# Patient Record
Sex: Male | Born: 1995 | Race: White | Hispanic: No | Marital: Single | Smoking: Never smoker
Health system: Southern US, Community
[De-identification: ages and names within clinical notes are randomized; demographics above are authoritative.]

---

## 2020-01-25 ENCOUNTER — Emergency Department: Payer: Self-pay

## 2020-01-25 ENCOUNTER — Encounter: Payer: Self-pay | Admitting: Emergency Medicine

## 2020-01-25 DIAGNOSIS — Y990 Civilian activity done for income or pay: Secondary | ICD-10-CM | POA: Insufficient documentation

## 2020-01-25 DIAGNOSIS — W000XXA Fall on same level due to ice and snow, initial encounter: Secondary | ICD-10-CM | POA: Insufficient documentation

## 2020-01-25 DIAGNOSIS — S82842A Displaced bimalleolar fracture of left lower leg, initial encounter for closed fracture: Secondary | ICD-10-CM | POA: Insufficient documentation

## 2020-01-25 NOTE — ED Triage Notes (Signed)
Pt arrived via EMS from work event where pt has been consuming copious amounts of alcohol tonight and slipped on ice and twisted the  left ankle. Pt has obvious deformity of area with swelling.

## 2020-01-26 ENCOUNTER — Emergency Department
Admission: EM | Admit: 2020-01-26 | Discharge: 2020-01-26 | Disposition: A | Discharge status: Home / Self Care | Payer: Self-pay | Attending: Emergency Medicine | Admitting: Emergency Medicine

## 2020-01-26 DIAGNOSIS — S82842A Displaced bimalleolar fracture of left lower leg, initial encounter for closed fracture: Secondary | ICD-10-CM

## 2020-01-26 DIAGNOSIS — W19XXXA Unspecified fall, initial encounter: Secondary | ICD-10-CM

## 2020-01-26 MED ORDER — HYDROCODONE-ACETAMINOPHEN 5-325 MG PO TABS: 2.0000 | ORAL_TABLET | Freq: Four times a day (QID) | ORAL | 0 refills | Status: AC | PRN

## 2020-01-26 MED ORDER — HYDROCODONE-ACETAMINOPHEN 5-325 MG PO TABS
2.0000 | ORAL_TABLET | Freq: Once | ORAL | Status: AC
  Administered 2020-01-26: 2 via ORAL
  Filled 2020-01-26: qty 2

## 2020-01-26 NOTE — ED Notes (Signed)
Pt refuses to wear paper pants and request that he keeps his pants on and he will cut them when he leaves ED.

## 2020-01-26 NOTE — ED Provider Notes (Signed)
[facility] Emergency Department Provider Note  ____________________________________________   Event Date/Time   First MD Initiated Contact with Patient 01/26/20 [phone]     (approximate)  I have reviewed the triage vital signs and the nursing notes.   HISTORY  Chief Complaint Fall    HPI Bryce Gardner is a 25 y.o. male reports no chronic medical issues and presents for evaluation of acute onset pain and swelling in his left ankle after a fall.  He reports that he is visiting this area from [place] for work-related trip.  He was at work already and consuming alcohol and slipped on the ice and fell, noting that his ankle was deformed and he felt a pop.  No numbness nor tingling, able to move his toes.  No other associated injuries, no loss of consciousness.   The pain is mild to moderate at rest, severe with movement.  Unable to bear weight.        History reviewed. No pertinent past medical history.  There are no problems to display for this patient.   History reviewed. No pertinent surgical history.  Prior to Admission medications   Medication Sig Start Date End Date Taking? Authorizing Provider  HYDROcodone-acetaminophen (NORCO/VICODIN) 5-325 MG tablet Take 2 tablets by mouth every 6 (six) hours as needed for moderate pain or severe pain. 01/26/20  Yes Loleta Rose, MD    Allergies Patient has no known allergies.  History reviewed. No pertinent family history.  Social History Social History   Tobacco Use  . Smoking status: Never Smoker  . Smokeless tobacco: Never Used  Substance Use Topics  . Alcohol use: Yes  . Drug use: Never    Review of Systems Constitutional: No fever/chills Cardiovascular: Denies chest pain. Respiratory: Denies shortness of breath. Gastrointestinal: No abdominal pain.   Musculoskeletal: Left ankle pain and swelling. Neurological: Negative for headaches, focal weakness or  numbness.   ____________________________________________   PHYSICAL EXAM:  VITAL SIGNS: ED Triage Vitals [01/25/20 2212]  Enc Vitals Group     BP (!) 144/91     Pulse Rate (!) 114     Resp 18     Temp 98.1 F (36.7 C)     Temp Source Oral     SpO2 98 %     Weight      Height      Head Circumference      Peak Flow      Pain Score      Pain Loc      Pain Edu?      Excl. in GC?     Constitutional: Alert and oriented.  Eyes: Conjunctivae are normal.  Head: Atraumatic. Neck: No stridor.  No meningeal signs.   Cardiovascular: Mild tachycardia, regular rhythm. Good peripheral circulation. Respiratory: Normal respiratory effort.  No retractions. Musculoskeletal: Ecchymosis and edema of that left ankle, present on both the lateral and medial sides but is actually more pronounced on the medial side.  Tender to palpation throughout.  Unable to tolerate range of motion.  Neurovascularly intact.  There is a small abrasion on the ankle but there is no evidence of open fracture or substantial deformity except for the swelling. Neurologic:  Normal speech and language. No gross focal neurologic deficits are appreciated.  Skin:  Skin is warm, dry and intact except as described above.   ____________________________________________   LABS (all labs ordered are listed, but only abnormal results are displayed)  Labs Reviewed - No data to display  ____________________________________________  EKG  No indication for emergent EKG ____________________________________________  RADIOLOGY I, Loleta Rose, personally viewed and evaluated these images (plain radiographs) as part of my medical decision making, as well as reviewing the written report by the radiologist.  ED MD interpretation: Oblique fracture of the distal fibula and possible posterior malleolus fracture with some mild lateral subluxation of the ankle mortise.  Official radiology report(s): DG Ankle Complete Left  Result  Date: 01/25/2020 CLINICAL DATA:  25 year old male with fall and trauma to the left ankle. EXAM: LEFT ANKLE COMPLETE - 3+ VIEW COMPARISON:  None. FINDINGS: Mildly displaced oblique fracture of the distal fibula with approximately 2 mm lateral displacement of the distal fracture fragment. A fracture of the posterior malleolus is not excluded on the lateral view. There is mild lateral subluxation of the ankle mortise. There is soft tissue swelling of the ankle. IMPRESSION: 1. Mildly displaced oblique fracture of the distal fibula and questionable fracture of the posterior malleolus. 2. Mild lateral subluxation of the ankle mortise. Electronically Signed   By: Elgie Collard M.D.   On: 01/25/2020 22:38    ____________________________________________   PROCEDURES   Procedure(s) performed (including Critical Care):  .Ortho Injury Treatment  Date/Time: 01/26/2020 2:30 AM Performed by: Loleta Rose, MD Authorized by: Loleta Rose, MD   Consent:    Consent obtained:  Verbal   Consent given by:  Patient   Risks discussed:  FractureInjury location: ankle Location details: left ankle Injury type: fracture Fracture type: bimalleolar Pre-procedure neurovascular assessment: neurovascularly intact Pre-procedure distal perfusion: normal Pre-procedure neurological function: normal Pre-procedure range of motion: reduced Manipulation performed: no Immobilization: splint Splint type: short leg and ankle stirrup Supplies used: Ortho-Glass Post-procedure neurovascular assessment: post-procedure neurovascularly intact Post-procedure distal perfusion: normal Post-procedure neurological function: normal Post-procedure range of motion: unchanged Patient tolerance: patient tolerated the procedure well with no immediate complications      ____________________________________________   INITIAL IMPRESSION / MDM / ASSESSMENT AND PLAN / ED COURSE  As part of my medical decision making, I reviewed  the following data within the electronic MEDICAL RECORD NUMBER Nursing notes reviewed and incorporated, Radiograph reviewed , Discussed with orthopedic surgeon, Notes from prior ED visits and [place] Controlled Substance Database  I personally reviewed the patient's imaging and agree with the radiologist's interpretation that the patient has an ankle fracture, essentially a bimalleolar fracture.  I called and spoke by phone with orthopedic surgery, Dr. Signa Kell, who advised that the patient likely would need surgery but not immediately.  He recommended posterior slab short leg splint with ankle stirrup, nonweightbearing, RICE, and orthopedic follow-up.  I advised the patient of this plan and he is going to look for an orthopedic surgeon as soon as he gets back to [place].  I gave him my usual customary management recommendations and return precautions.  No evidence of compartment syndrome.  Patient's pain is well controlled and he is currently clinically sober and is looking for a ride back to his hotel.  ____________________________________________  FINAL CLINICAL IMPRESSION(S) / ED DIAGNOSES  Final diagnoses:  Ankle fracture, bimalleolar, closed, left, initial encounter  Fall, initial encounter     MEDICATIONS GIVEN DURING THIS VISIT:  Medications  HYDROcodone-acetaminophen (NORCO/VICODIN) 5-325 MG per tablet 2 tablet (2 tablets Oral Given 01/26/20 0134)     ED Discharge Orders         Ordered    HYDROcodone-acetaminophen (NORCO/VICODIN) 5-325 MG tablet  Every 6 hours PRN        01/26/20  0200          *Please note:  Singleton Hickox was evaluated in Emergency Department on 01/26/2020 for the symptoms described in the history of present illness. He was evaluated in the context of the global COVID-19 pandemic, which necessitated consideration that the patient might be at risk for infection with the SARS-CoV-2 virus that causes COVID-19. Institutional protocols and algorithms that pertain  to the evaluation of patients at risk for COVID-19 are in a state of rapid change based on information released by regulatory bodies including the CDC and federal and state organizations. These policies and algorithms were followed during the patient's care in the ED.  Some ED evaluations and interventions may be delayed as a result of limited staffing during and after the pandemic.*  Note:  This document was prepared using Dragon voice recognition software and may include unintentional dictation errors.   Loleta Rose, MD 01/26/20 [phone]

## 2020-01-26 NOTE — Discharge Instructions (Addendum)
As we discussed, our orthopedic surgeon (Dr. Signa Kell) feels it is likely you will need surgery on your ankle to help it heal properly.  Please read through the included information.  The most important thing is to not bear any weight on the ankle, use crutches, keep your ankle elevated, and use ice or cold compresses as much as possible.  Please use over-the-counter ibuprofen and/or Tylenol as needed for pain control. Take Norco as prescribed for severe pain. Do not drink alcohol, drive or participate in any other potentially dangerous activities while taking this medication as it may make you sleepy. Do not take this medication with any other sedating medications, either prescription or over-the-counter. If you were prescribed Percocet or Vicodin, do not take these with acetaminophen (Tylenol) as it is already contained within these medications.   This medication is an opiate (or narcotic) pain medication and can be habit forming.  Use it as little as possible to achieve adequate pain control.  Do not use or use it with extreme caution if you have a history of opiate abuse or dependence.  If you are on a pain contract with your primary care doctor or a pain specialist, be sure to let them know you were prescribed this medication today from the [facility] Emergency Department.  This medication is intended for your use only - do not give any to anyone else and keep it in a secure place where nobody else, especially children, have access to it.  It will also cause or worsen constipation, so you may want to consider taking an over-the-counter stool softener while you are taking this medication.  Again, it is very important that you call on Monday whether you are still here in [place] or whether you have gone home to [place].  You need to follow-up with an orthopedic surgeon a this please burn a CD of some x-rays for patient to have that lives out of state yeah yeah he is back of the legs  you may send a message to Tammy okay remind me and I obviously know Tammy but I do not know I cannot remember her last name is okay also I find is that if you are my telling her about ultrasound and the cystic his name is Xachary Hambly in room 54 I just need to it burned onto a CD his ankle x-rays thank you I advised

## 2022-03-01 IMAGING — CR DG ANKLE COMPLETE 3+V*L*
3 series · 3 of 3 positions shown · non-contrast
Comparison: None.

CLINICAL DATA: 24-year-old male with fall and trauma to the left
ankle.

EXAM:
LEFT ANKLE COMPLETE - 3+ VIEW

[ankle ap]
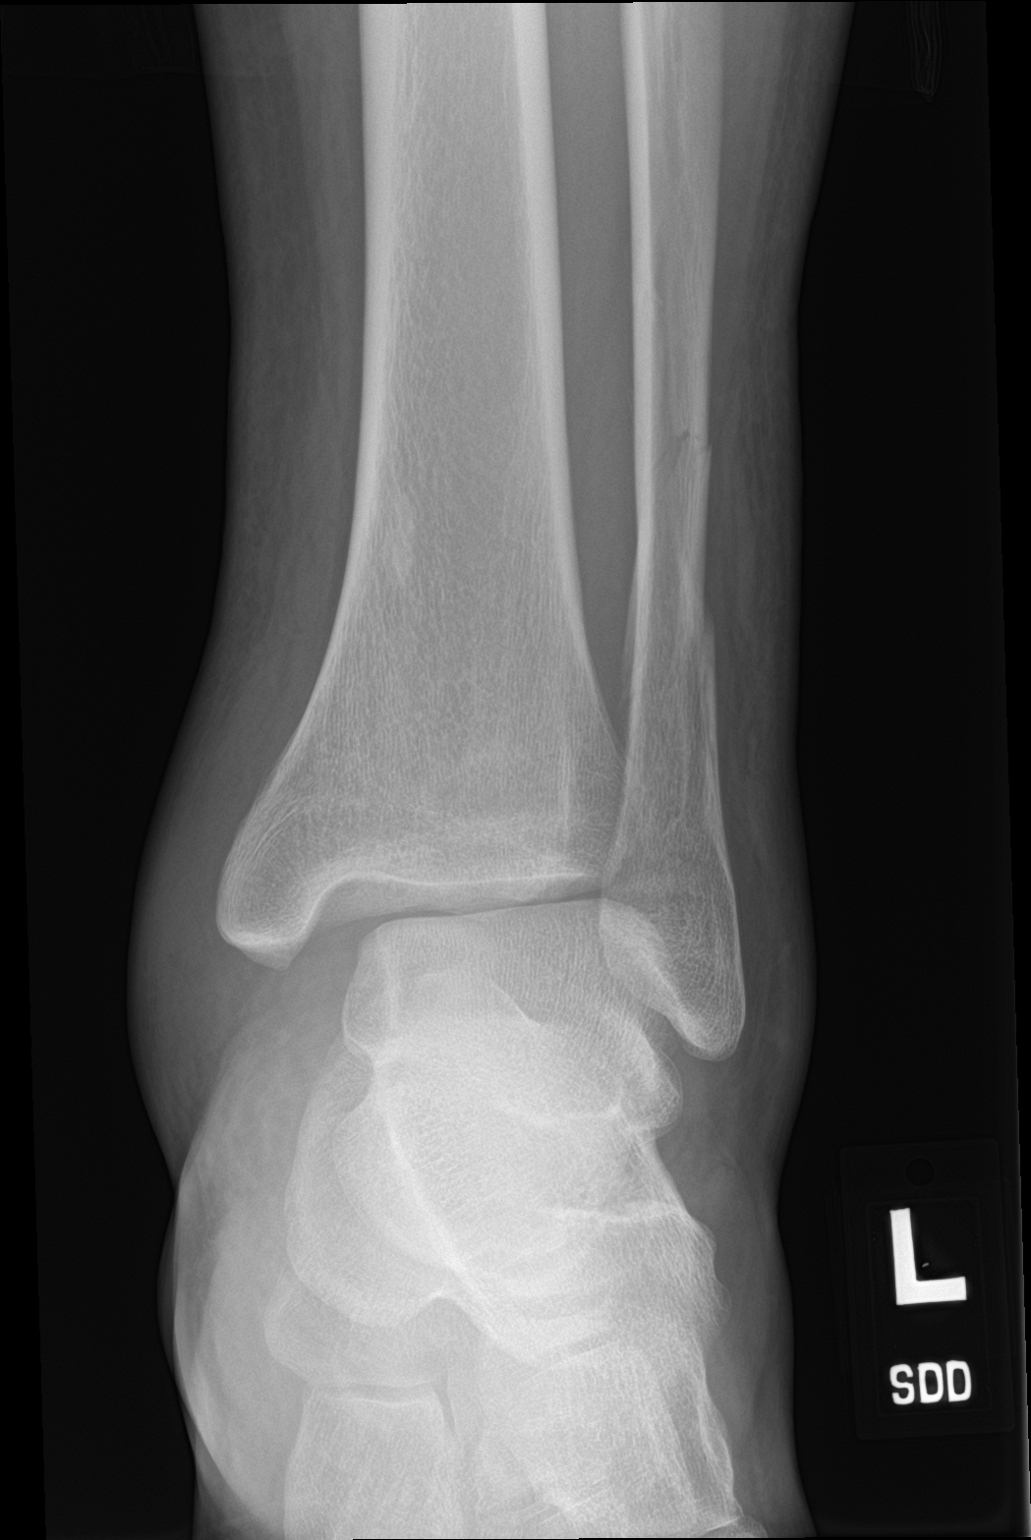

[ankle obl]
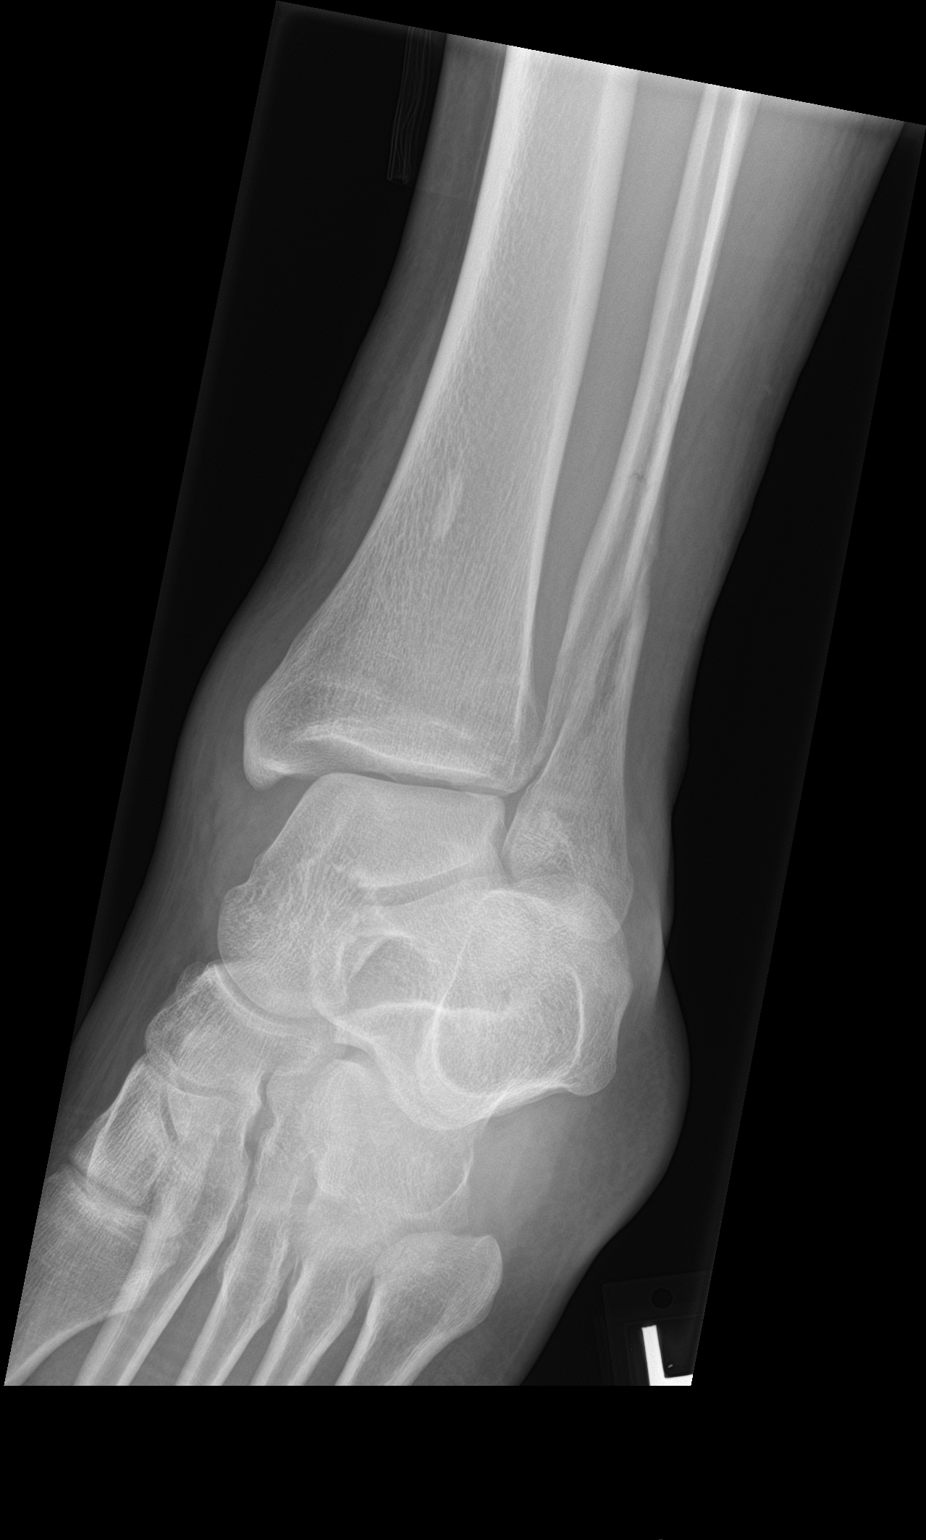

[ankle lat]
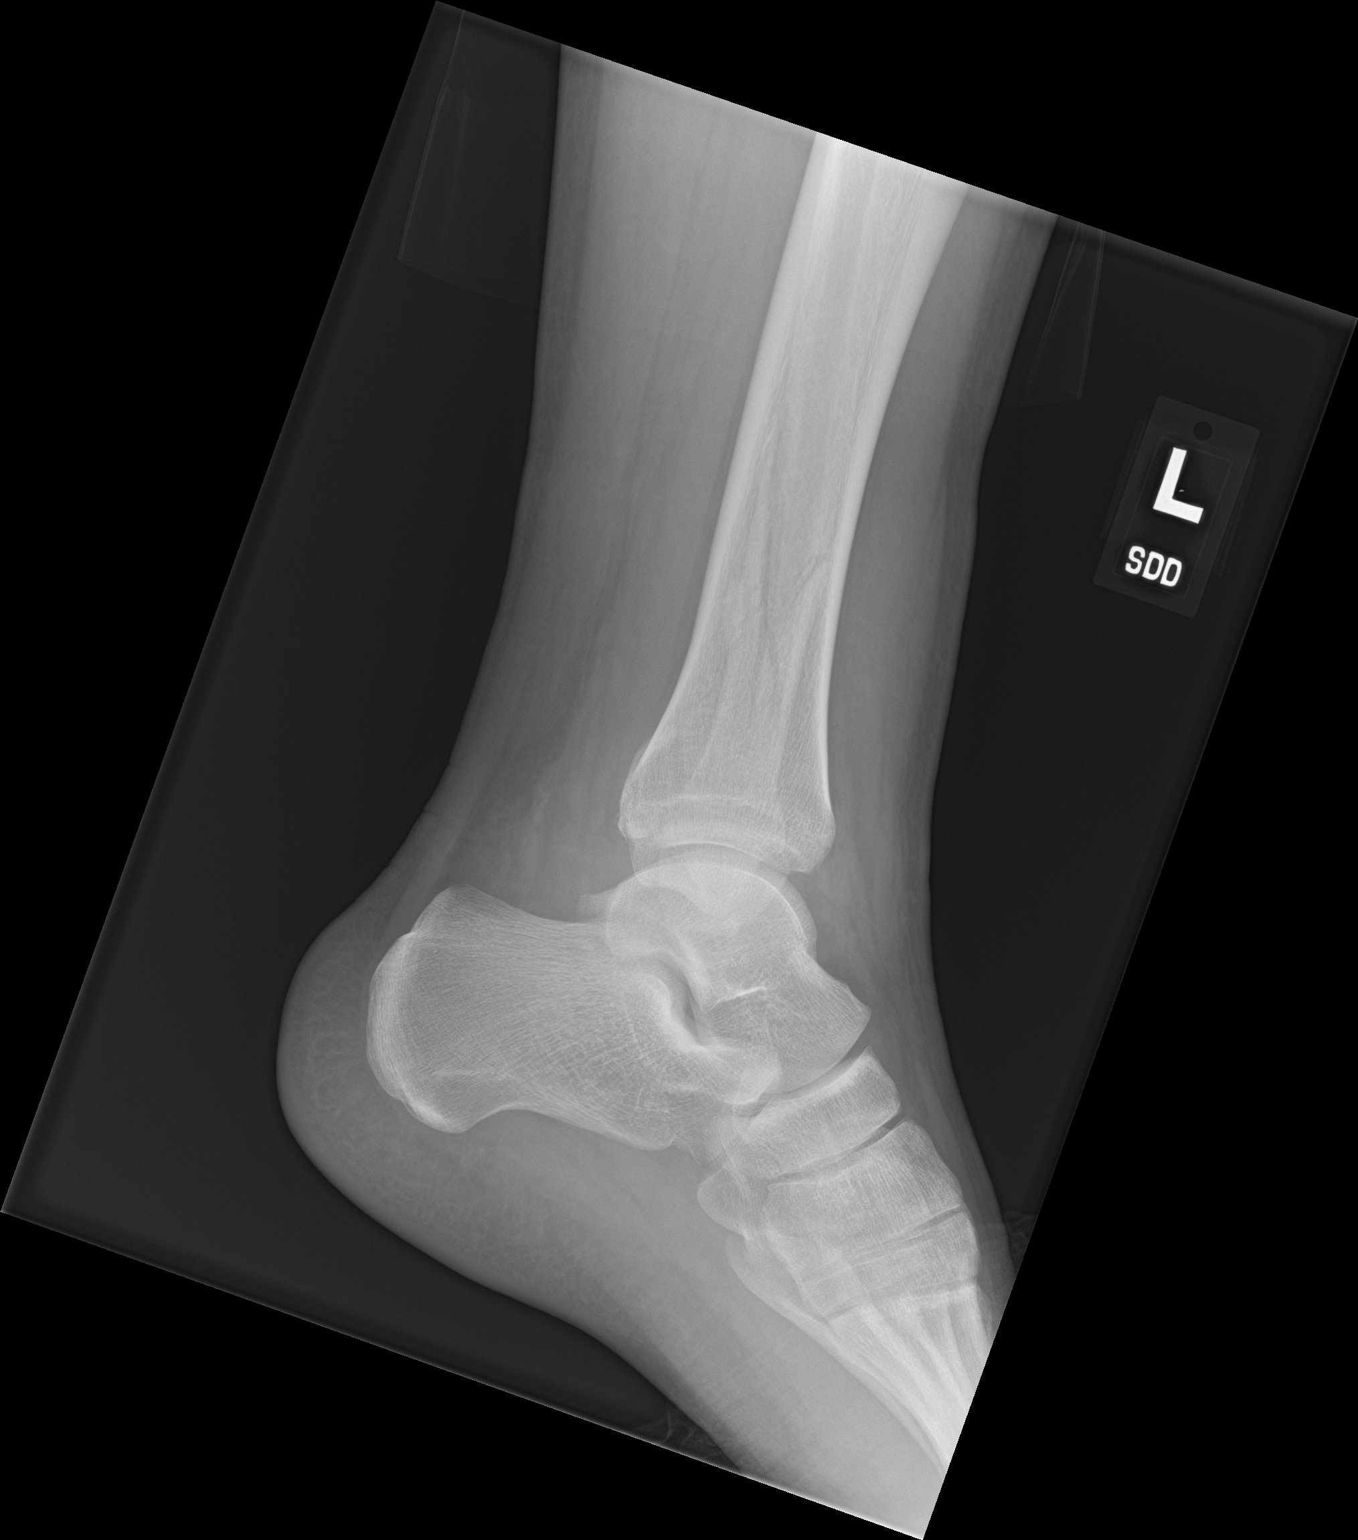

[3 of 3 positions shown; findings below may reference images not displayed]

FINDINGS: Mildly displaced oblique fracture of the distal fibula with
approximately 2 mm lateral displacement of the distal fracture
fragment. A fracture of the posterior malleolus is not excluded on
the lateral view. There is mild lateral subluxation of the ankle
mortise. There is soft tissue swelling of the ankle.
IMPRESSION: 1. Mildly displaced oblique fracture of the distal fibula and
questionable fracture of the posterior malleolus.
2. Mild lateral subluxation of the ankle mortise.
